# Patient Record
Sex: Male | Born: 1995 | Race: White | Hispanic: No | Marital: Single | Smoking: Never smoker
Health system: Southern US, Community
[De-identification: ages and names within clinical notes are randomized; demographics above are authoritative.]

---

## 2017-01-13 ENCOUNTER — Emergency Department: Payer: 59

## 2017-01-13 ENCOUNTER — Encounter: Payer: Self-pay | Admitting: Emergency Medicine

## 2017-01-13 ENCOUNTER — Observation Stay
Admission: EM | Admit: 2017-01-13 | Discharge: 2017-01-15 | Disposition: A | Discharge status: Home / Self Care | Payer: 59 | Attending: Surgery | Admitting: Surgery

## 2017-01-13 DIAGNOSIS — R109 Unspecified abdominal pain: Secondary | ICD-10-CM | POA: Diagnosis present

## 2017-01-13 DIAGNOSIS — K810 Acute cholecystitis: Secondary | ICD-10-CM | POA: Diagnosis not present

## 2017-01-13 DIAGNOSIS — K8000 Calculus of gallbladder with acute cholecystitis without obstruction: Principal | ICD-10-CM | POA: Insufficient documentation

## 2017-01-13 LAB — COMPREHENSIVE METABOLIC PANEL
ALBUMIN: 4.6 g/dL (ref 3.5–5.0)
ALK PHOS: 74 U/L (ref 38–126)
ALT: 62 U/L (ref 17–63)
AST: 31 U/L (ref 15–41)
Anion gap: 10 (ref 5–15)
BILIRUBIN TOTAL: 1.2 mg/dL (ref 0.3–1.2)
BUN: 9 mg/dL (ref 6–20)
CALCIUM: 9.7 mg/dL (ref 8.9–10.3)
CO2: 23 mmol/L (ref 22–32)
Chloride: 105 mmol/L (ref 101–111)
Creatinine, Ser: 0.83 mg/dL (ref 0.61–1.24)
GFR calc Af Amer: >60 mL/min (ref >60)
GFR calc non Af Amer: >60 mL/min (ref >60)
Glucose, Bld: 120 mg/dL — ABNORMAL HIGH (ref 65–99)
POTASSIUM: 4 mmol/L (ref 3.5–5.1)
Sodium: 138 mmol/L (ref 135–145)
TOTAL PROTEIN: 8.3 g/dL — AB (ref 6.5–8.1)

## 2017-01-13 LAB — CBC WITH DIFFERENTIAL/PLATELET
BASOS ABS: 0 10*3/uL (ref 0–0.1)
BASOS PCT: 0 %
Eosinophils Absolute: 0.3 10*3/uL (ref 0–0.7)
Eosinophils Relative: 2 %
HEMATOCRIT: 49.7 % (ref 40.0–52.0)
HEMOGLOBIN: 17.7 g/dL (ref 13.0–18.0)
LYMPHS PCT: 7 %
Lymphs Abs: 1.1 10*3/uL (ref 1.0–3.6)
MCH: 29 pg (ref 26.0–34.0)
MCHC: 35.7 g/dL (ref 32.0–36.0)
MCV: 81.2 fL (ref 80.0–100.0)
Monocytes Absolute: 0.8 10*3/uL (ref 0.2–1.0)
Monocytes Relative: 5 %
NEUTROS ABS: 13 10*3/uL — AB (ref 1.4–6.5)
NEUTROS PCT: 86 %
Platelets: 311 10*3/uL (ref 150–440)
RBC: 6.12 MIL/uL — AB (ref 4.40–5.90)
RDW: 13.3 % (ref 11.5–14.5)
WBC: 15.2 10*3/uL — ABNORMAL HIGH (ref 3.8–10.6)

## 2017-01-13 LAB — URINALYSIS, COMPLETE (UACMP) WITH MICROSCOPIC
BILIRUBIN URINE: NEGATIVE
Glucose, UA: NEGATIVE mg/dL
Hgb urine dipstick: NEGATIVE
Ketones, ur: 20 mg/dL — AB
LEUKOCYTES UA: NEGATIVE
Nitrite: NEGATIVE
Protein, ur: NEGATIVE mg/dL
SPECIFIC GRAVITY, URINE: 1.012 (ref 1.005–1.030)
SQUAMOUS EPITHELIAL / LPF: NONE SEEN
pH: 6 (ref 5.0–8.0)

## 2017-01-13 LAB — LIPASE, BLOOD: Lipase: 26 U/L (ref 11–51)

## 2017-01-13 MED ORDER — MORPHINE SULFATE (PF) 4 MG/ML IV SOLN
4.0000 mg | Freq: Once | INTRAVENOUS | Status: AC
  Administered 2017-01-13: 4 mg via INTRAVENOUS
  Filled 2017-01-13: qty 1

## 2017-01-13 MED ORDER — HYDROMORPHONE HCL 1 MG/ML IJ SOLN: 0.5000 mg | INTRAMUSCULAR | Status: DC | PRN

## 2017-01-13 MED ORDER — ONDANSETRON HCL 4 MG/2ML IJ SOLN
4.0000 mg | Freq: Four times a day (QID) | INTRAMUSCULAR | Status: DC | PRN
  Filled 2017-01-13: qty 2

## 2017-01-13 MED ORDER — IOPAMIDOL (ISOVUE-300) INJECTION 61%
30.0000 mL | Freq: Once | INTRAVENOUS | Status: AC | PRN
  Administered 2017-01-13: 30 mL via ORAL

## 2017-01-13 MED ORDER — PANTOPRAZOLE SODIUM 40 MG IV SOLR
40.0000 mg | Freq: Every day | INTRAVENOUS | Status: DC
  Administered 2017-01-13 – 2017-01-14 (×2): 40 mg via INTRAVENOUS
  Filled 2017-01-13 (×2): qty 40

## 2017-01-13 MED ORDER — HYDROMORPHONE HCL 1 MG/ML IJ SOLN
1.0000 mg | Freq: Once | INTRAMUSCULAR | Status: AC
  Administered 2017-01-13: 1 mg via INTRAVENOUS
  Filled 2017-01-13: qty 1

## 2017-01-13 MED ORDER — ONDANSETRON HCL 4 MG/2ML IJ SOLN
4.0000 mg | Freq: Once | INTRAMUSCULAR | Status: AC
  Administered 2017-01-13: 4 mg via INTRAVENOUS
  Filled 2017-01-13: qty 2

## 2017-01-13 MED ORDER — IOPAMIDOL (ISOVUE-300) INJECTION 61%
100.0000 mL | Freq: Once | INTRAVENOUS | Status: AC | PRN
  Administered 2017-01-13: 100 mL via INTRAVENOUS

## 2017-01-13 MED ORDER — LACTATED RINGERS IV SOLN
125.0000 mL/h | INTRAVENOUS | Status: DC
  Administered 2017-01-13 – 2017-01-14 (×3): 125 mL/h via INTRAVENOUS

## 2017-01-13 MED ORDER — PIPERACILLIN-TAZOBACTAM 3.375 G IVPB
3.3750 g | Freq: Three times a day (TID) | INTRAVENOUS | Status: DC
  Administered 2017-01-13 – 2017-01-15 (×6): 3.375 g via INTRAVENOUS
  Filled 2017-01-13 (×7): qty 50

## 2017-01-13 MED ORDER — HEPARIN SODIUM (PORCINE) 5000 UNIT/ML IJ SOLN
5000.0000 [IU] | Freq: Three times a day (TID) | INTRAMUSCULAR | Status: DC
  Filled 2017-01-13: qty 1

## 2017-01-13 MED ORDER — KETOROLAC TROMETHAMINE 30 MG/ML IJ SOLN
30.0000 mg | Freq: Four times a day (QID) | INTRAMUSCULAR | Status: DC
  Administered 2017-01-13 – 2017-01-14 (×3): 30 mg via INTRAVENOUS
  Filled 2017-01-13 (×5): qty 1

## 2017-01-13 MED ORDER — ONDANSETRON 4 MG PO TBDP: 4.0000 mg | ORAL_TABLET | Freq: Four times a day (QID) | ORAL | Status: DC | PRN

## 2017-01-13 MED ORDER — SODIUM CHLORIDE 0.9 % IV BOLUS (SEPSIS)
1000.0000 mL | Freq: Once | INTRAVENOUS | Status: AC
  Administered 2017-01-13: 1000 mL via INTRAVENOUS

## 2017-01-13 NOTE — ED Provider Notes (Addendum)
[facility] Emergency Department Provider Note  ____________________________________________   I have reviewed the triage vital signs and the nursing notes.   HISTORY  Chief Complaint Bloated and Abdominal Pain    HPI Kenneth Joyce is a 21 y.o. male who presents today complaining of abdominal pain mostly in the epigastric and right upper quadrant. There for 2 days. Has been burping. Denies fever. Has had some slightly loose stool. Denies melena bright red blood per rectum or fever. Has not vomited but has been nauseated. Has not had this before. No prior surgical history. No other medical problems. He is sharp, mostly in epigastric right upper quadrant does not radiate, nothing makes it better food might make it worse. There for 2 days.     History reviewed. No pertinent past medical history.  There are no active problems to display for this patient.   History reviewed. No pertinent surgical history.  Prior to Admission medications   Not on File    Allergies Patient has no known allergies.  History reviewed. No pertinent family history.  Social History Social History  Substance Use Topics  . Smoking status: Never Smoker  . Smokeless tobacco: Never Used  . Alcohol use No    Review of Systems Constitutional: No fever/chills Eyes: No visual changes. ENT: No sore throat. No stiff neck no neck pain Cardiovascular: Denies chest pain. Respiratory: Denies shortness of breath. Gastrointestinal:   no vomiting.  No diarrhea.  No constipation. Genitourinary: Negative for dysuria. Musculoskeletal: Negative lower extremity swelling Skin: Negative for rash. Neurological: Negative for severe headaches, focal weakness or numbness. 10-point ROS otherwise negative.  ____________________________________________   PHYSICAL EXAM:  VITAL SIGNS: ED Triage Vitals  Enc Vitals Group     BP 01/13/17 0655 (!) 156/87     Pulse Rate 01/13/17 0655 94   Resp 01/13/17 0655 17     Temp 01/13/17 0655 98 F (36.7 C)     Temp Source 01/13/17 0655 Oral     SpO2 01/13/17 0655 100 %     Weight 01/13/17 0655 200 lb (90.7 kg)     Height 01/13/17 0655  (1.702 m)     Head Circumference --      Peak Flow --      Pain Score 01/13/17 0900 8     Pain Loc --      Pain Edu? --      Excl. in GC? --     Constitutional: Alert and oriented. Well appearing and in no acute distress. Eyes: Conjunctivae are normal. PERRL. EOMI. Head: Atraumatic. Nose: No congestion/rhinnorhea. Mouth/Throat: Mucous membranes are moist.  Oropharynx non-erythematous. Neck: No stridor.   Nontender with no meningismus Cardiovascular: Normal rate, regular rhythm. Grossly normal heart sounds.  Good peripheral circulation. Respiratory: Normal respiratory effort.  No retractions. Lungs CTAB. Abdominal:Diffuse tenderness noted but press when the epigastric and specifically the right upper quadrant tenderness with voluntary guarding and no rebound.. No distention. Back:  There is no focal tenderness or step off.  there is no midline tenderness there are no lesions noted. there is no CVA tenderness Musculoskeletal: No lower extremity tenderness, no upper extremity tenderness. No joint effusions, no DVT signs strong distal pulses no edema Neurologic:  Normal speech and language. No gross focal neurologic deficits are appreciated.  Skin:  Skin is warm, dry and intact. No rash noted. Psychiatric: Mood and affect are normal. Speech and behavior are normal.  ____________________________________________   LABS (all labs ordered are listed, but  only abnormal results are displayed)  Labs Reviewed  CBC WITH DIFFERENTIAL/PLATELET - Abnormal; Notable for the following:       Result Value   WBC 15.2 (*)    RBC 6.12 (*)    Neutro Abs 13.0 (*)    All other components within normal limits  COMPREHENSIVE METABOLIC PANEL - Abnormal; Notable for the following:    Glucose, Bld 120 (*)     Total Protein 8.3 (*)    All other components within normal limits  LIPASE, BLOOD  URINALYSIS, COMPLETE (UACMP) WITH MICROSCOPIC   ____________________________________________  EKG  I personally interpreted any EKGs ordered by me or triage  ____________________________________________  RADIOLOGY  I reviewed any imaging ordered by me or triage that were performed during my shift and, if possible, patient and/or family made aware of any abnormal findings. ____________________________________________   PROCEDURES  Procedure(s) performed: None  Procedures  Critical Care performed: None  ____________________________________________   INITIAL IMPRESSION / ASSESSMENT AND PLAN / ED COURSE  Pertinent labs & imaging results that were available during my care of the patient were reviewed by me and considered in my medical decision making (see chart for details).  Patient suffers from obesity, has abdominal pain for 2 days, diffuse but really localizing to the right upper quadrant on exam. He has slight right lower quadrant tenderness but all is guarding in all of his focality is around the gallbladder. Ultrasound shows gallstones but no cholecystitis. White count is elevated. He's had 2 doses of pain medication and still quite tender. We'll call surgery for further evaluation.  ----------------------------------------- 9:40 AM on 01/13/2017 -----------------------------------------  Surgery in the OR, we will await their call back.  ----------------------------------------- 10:49 AM on 01/13/2017 -----------------------------------------  Surgery agrees this is most likely gallbladder but they also agree that he still has some residual lower abdominal discomfort. The possibility of atypical appendicitis is noted. We will obtain CT scan. However, surgery does feel that he likely will need to be admitted given ongoing symptoms. Patient has vomited times one since he's been here has  ongoing discomfort although it is somewhat better after pain medication. If CT scan shows appendicitis obviously he will be admitted to surgery, but if it does not, and we do find only gallbladder disease, which is the more likely diagnosis, he will be admitted to surgery.   ____________________________________________   FINAL CLINICAL IMPRESSION(S) / ED DIAGNOSES  Final diagnoses:  Abdominal pain      This chart was dictated using voice recognition software.  Despite best efforts to proofread,  errors can occur which can change meaning.      Jeanmarie Plant, MD 01/13/17 9528    Jeanmarie Plant, MD 01/13/17 1049

## 2017-01-13 NOTE — H&P (Signed)
Date of Admission:  01/13/2017  Reason for Admission:  Acute cholecystitis  History of Present Illness: Kenneth Joyce is a 21 y.o. male who presents with a 2 day history of right upper quadrant abdominal pain, associated with nausea and starting today, vomiting.  The patient reports that he has not noted these symptoms before.  Denies any fevers at home.  Reports trying some po intake yesterday but felt worse pain and nausea.  He did have emesis today in the ED.  Continues having flatus and had a BM in the ED as well.  No ameliorating factors.    Initially, ultrasound was done in ED which showed cholelithiasis only with no thickening or fluid.  The patient also had complaint on initial evaluation of right lower quadrant pain on palpation, so CT scan was also obtained which showed a normal appendix.   Had a WBC 15.2 with normal LFTs.  Past Medical History: --None  Past Surgical History: --None  Home Medications: Prior to Admission medications   None    Allergies: No Known Allergies  Social History:  reports that he has never smoked. He has never used smokeless tobacco. He reports that he does not drink alcohol or use drugs.   Family History: No family members that have required cholecystectomy  Review of Systems: Review of Systems  Constitutional: Negative for chills and fever.  HENT: Negative for hearing loss.   Eyes: Negative for blurred vision.  Respiratory: Negative for cough.   Cardiovascular: Negative for chest pain.  Gastrointestinal: Positive for abdominal pain, nausea and vomiting.  Genitourinary: Negative for dysuria.  Musculoskeletal: Negative for myalgias.  Skin: Negative for rash.  Neurological: Negative for dizziness.  Psychiatric/Behavioral: Negative for depression.  All other systems reviewed and are negative.   Physical Exam BP 138/85 (BP Location: Right Arm)   Pulse 95   Temp 98.3 F (36.8 C) (Oral)   Resp 15   Ht  (1.702 m)   Wt 90.7 kg  (200 lb)   SpO2 100%   BMI 31.32 kg/m  CONSTITUTIONAL: No acute distress HEENT:  Normocephalic, atraumatic, extraocular motion intact. NECK: Trachea is midline, and there is no jugular venous distension.  RESPIRATORY:  Lungs are clear, and breath sounds are equal bilaterally. Normal respiratory effort without pathologic use of accessory muscles. CARDIOVASCULAR: Heart is regular without murmurs, gallops, or rubs. GI: The abdomen is soft, non-distended.  On initial exam, the patient had a positive Murphy's sign as well as some right lower quadrant tenderness.  After CT scan, the patient reports feeling better and had tenderness to palpation in right upper quadrant only, but no Murphy's sign  Had not received pain medication in 4 hrs. MUSCULOSKELETAL:  Normal muscle strength and tone in all four extremities.  No peripheral edema or cyanosis. SKIN: Skin turgor is normal. There are no pathologic skin lesions.  NEUROLOGIC:  Motor and sensation is grossly normal.  Cranial nerves are grossly intact. PSYCH:  Alert and oriented to person, place and time. Affect is normal.  Laboratory Analysis: Results for orders placed or performed during the hospital encounter of 01/13/17 (from the past 24 hour(s))  CBC with Differential     Status: Abnormal   Collection Time: 01/13/17  7:40 AM  Result Value Ref Range   WBC 15.2 (H) 3.8 - 10.6 K/uL   RBC 6.12 (H) 4.40 - 5.90 MIL/uL   Hemoglobin 17.7 13.0 - 18.0 g/dL   HCT 16.1 09.6 - 04.5 %   MCV 81.2 80.0 -  100.0 fL   MCH 29.0 26.0 - 34.0 pg   MCHC 35.7 32.0 - 36.0 g/dL   RDW 16.1 09.6 - 04.5 %   Platelets 311 150 - 440 K/uL   Neutrophils Relative % 86 %   Neutro Abs 13.0 (H) 1.4 - 6.5 K/uL   Lymphocytes Relative 7 %   Lymphs Abs 1.1 1.0 - 3.6 K/uL   Monocytes Relative 5 %   Monocytes Absolute 0.8 0.2 - 1.0 K/uL   Eosinophils Relative 2 %   Eosinophils Absolute 0.3 0 - 0.7 K/uL   Basophils Relative 0 %   Basophils Absolute 0.0 0 - 0.1 K/uL   Comprehensive metabolic panel     Status: Abnormal   Collection Time: 01/13/17  7:40 AM  Result Value Ref Range   Sodium 138 135 - 145 mmol/L   Potassium 4.0 3.5 - 5.1 mmol/L   Chloride 105 101 - 111 mmol/L   CO2 23 22 - 32 mmol/L   Glucose, Bld 120 (H) 65 - 99 mg/dL   BUN 9 6 - 20 mg/dL   Creatinine, Ser 4.09 0.61 - 1.24 mg/dL   Calcium 9.7 8.9 - 81.1 mg/dL   Total Protein 8.3 (H) 6.5 - 8.1 g/dL   Albumin 4.6 3.5 - 5.0 g/dL   AST 31 15 - 41 U/L   ALT 62 17 - 63 U/L   Alkaline Phosphatase 74 38 - 126 U/L   Total Bilirubin 1.2 0.3 - 1.2 mg/dL   GFR calc non Af Amer >60 >60 mL/min   GFR calc Af Amer >60 >60 mL/min   Anion gap 10 5 - 15  Lipase, blood     Status: None   Collection Time: 01/13/17  7:40 AM  Result Value Ref Range   Lipase 26 11 - 51 U/L  Urinalysis, Complete w Microscopic     Status: Abnormal   Collection Time: 01/13/17  7:40 AM  Result Value Ref Range   Color, Urine YELLOW (A) YELLOW   APPearance CLEAR (A) CLEAR   Specific Gravity, Urine 1.012 1.005 - 1.030   pH 6.0 5.0 - 8.0   Glucose, UA NEGATIVE NEGATIVE mg/dL   Hgb urine dipstick NEGATIVE NEGATIVE   Bilirubin Urine NEGATIVE NEGATIVE   Ketones, ur 20 (A) NEGATIVE mg/dL   Protein, ur NEGATIVE NEGATIVE mg/dL   Nitrite NEGATIVE NEGATIVE   Leukocytes, UA NEGATIVE NEGATIVE   RBC / HPF 0-5 0 - 5 RBC/hpf   WBC, UA 0-5 0 - 5 WBC/hpf   Bacteria, UA RARE (A) NONE SEEN   Squamous Epithelial / LPF NONE SEEN NONE SEEN   Mucous PRESENT     Imaging: Ct Abdomen Pelvis W Contrast  Result Date: 01/13/2017 CLINICAL DATA:  pain mostly in the epigastric and right upper quadrant. There for 2 days. Has been burping. Denies fever. Has had some slightly loose stool. Denies melena bright red blood per rectum or fever. Has not vomited but has been nauseated. Has not had this before. No prior surgical history. No other medical problems. He is sharp, mostly in epigastric right upper quadrant does not radiate, nothing makes it  better food might make it worse. EXAM: CT ABDOMEN AND PELVIS WITH CONTRAST TECHNIQUE: Multidetector CT imaging of the abdomen and pelvis was performed using the standard protocol following bolus administration of intravenous contrast. CONTRAST:  ISOVUE-300 IOPAMIDOL (ISOVUE-300) INJECTION 61% COMPARISON:  Ultrasound from earlier the same day FINDINGS: Lower chest: No acute abnormality.  Moderate Hiatal hernia. Hepatobiliary: No focal  liver abnormality is seen. No calcified gallstones, gallbladder wall thickening, or biliary dilatation. Pancreas: Unremarkable. No pancreatic ductal dilatation or surrounding inflammatory changes. Spleen: Normal in size without focal abnormality. Adrenals/Urinary Tract: Adrenal glands are unremarkable. Kidneys are normal, without renal calculi, focal lesion, or hydronephrosis. Bladder is incompletely distended with mild wall thickening suggested. Stomach/Bowel: Moderate Hiatal hernia. Small bowel nondilated. Good distal contrast progression. Normal appendix. Colon is nondilated, unremarkable. Vascular/Lymphatic: No significant vascular findings are present. No enlarged abdominal or pelvic lymph nodes. Reproductive: Mild prostatic enlargement with central coarse calcifications. Other: No ascites.  No free air. Musculoskeletal: No acute or significant osseous findings. IMPRESSION: 1. No acute findings. 2. Moderate hiatal hernia. 3. Prostatic enlargement with mild thickening of the wall of the urinary bladder suggesting a degree of outflow obstruction. Electronically Signed   By: Corlis Leak M.D.   On: 01/13/2017 12:49   Dg Abdomen Acute W/chest  Result Date: 01/13/2017 CLINICAL DATA:  Abdominal pain, bloating. EXAM: DG ABDOMEN ACUTE W/ 1V CHEST COMPARISON:  None. FINDINGS: There is no evidence of dilated bowel loops or free intraperitoneal air. No radiopaque calculi or other significant radiographic abnormality is seen. Heart size and mediastinal contours are within normal  limits. Both lungs are clear. IMPRESSION: Negative abdominal radiographs.  No acute cardiopulmonary disease. Electronically Signed   By: Charlett Nose M.D.   On: 01/13/2017 08:10   US Abdomen Limited Ruq  Result Date: 01/13/2017 CLINICAL DATA:  Abdominal pain EXAM: US ABDOMEN LIMITED - RIGHT UPPER QUADRANT COMPARISON:  None. FINDINGS: Gallbladder: Multiple mobile small stones, the largest 5 mm. No wall thickening. Negative sonographic Murphy's. Common bile duct: Diameter: Normal caliber, 5 mm Liver: Increased echotexture compatible with fatty infiltration. No focal abnormality or biliary ductal dilatation. IMPRESSION: Several small mobile gallstones.  No evidence of cholecystitis. Fatty infiltration of the liver. Electronically Signed   By: Charlett Nose M.D.   On: 01/13/2017 08:40    Assessment and Plan: This is a 21 y.o. male who presents with a 2-day history of abdominal pain and nausea.  He has an elevated WBC and although he feels better on second examination, he likely has acute cholecystitis.  I have independently viewed the patient's imaging studies and reviewed his laboratory studies.  Overall, his imaging is not too impressive, but he was initially very tender with a positive Murphy's sign.  His WBC is elevated at 15.2.   Was discussing with the patient potential surgical management for cholecystitis given his symptoms, but the patient preferred to attempt conservative management first.  Discussed with the patient that this would entail being NPO with IV fluid hydration as well as IV antibiotics.  He does understand that depending on his progress, he could still require a cholecystectomy during this admission if there is failure of medical management.  He does understand that if things improve, then we would slowly advance his diet and transition to oral antibiotics.  Discussed that in the future, would recommend performing a cholecystectomy, but this would be elective as long as he improves during  this admission.  The patient understands this plan and all of his questions have been answered.    Howie Ill, MD [facility]

## 2017-01-13 NOTE — ED Triage Notes (Signed)
Pt ambulatory to triage with steady gait, no distress noted. Pt reports having bloating since Tuesday, no BM, pt passing gas and having "sulfur burps." Pt denies N/V.

## 2017-01-14 DIAGNOSIS — K810 Acute cholecystitis: Secondary | ICD-10-CM | POA: Diagnosis not present

## 2017-01-14 LAB — COMPREHENSIVE METABOLIC PANEL
ALK PHOS: 54 U/L (ref 38–126)
ALT: 37 U/L (ref 17–63)
ANION GAP: 3 — AB (ref 5–15)
AST: 18 U/L (ref 15–41)
Albumin: 3.4 g/dL — ABNORMAL LOW (ref 3.5–5.0)
BILIRUBIN TOTAL: 1.3 mg/dL — AB (ref 0.3–1.2)
BUN: 6 mg/dL (ref 6–20)
CALCIUM: 8.6 mg/dL — AB (ref 8.9–10.3)
CO2: 28 mmol/L (ref 22–32)
Chloride: 110 mmol/L (ref 101–111)
Creatinine, Ser: 0.74 mg/dL (ref 0.61–1.24)
GFR calc Af Amer: >60 mL/min (ref >60)
GFR calc non Af Amer: >60 mL/min (ref >60)
GLUCOSE: 97 mg/dL (ref 65–99)
Potassium: 3.3 mmol/L — ABNORMAL LOW (ref 3.5–5.1)
Sodium: 141 mmol/L (ref 135–145)
TOTAL PROTEIN: 6 g/dL — AB (ref 6.5–8.1)

## 2017-01-14 LAB — CBC WITH DIFFERENTIAL/PLATELET
BASOS PCT: 0 %
Basophils Absolute: 0 10*3/uL (ref 0–0.1)
Eosinophils Absolute: 0.8 10*3/uL — ABNORMAL HIGH (ref 0–0.7)
Eosinophils Relative: 7 %
HEMATOCRIT: 40.8 % (ref 40.0–52.0)
HEMOGLOBIN: 14.5 g/dL (ref 13.0–18.0)
LYMPHS ABS: 2.3 10*3/uL (ref 1.0–3.6)
Lymphocytes Relative: 20 %
MCH: 29.4 pg (ref 26.0–34.0)
MCHC: 35.5 g/dL (ref 32.0–36.0)
MCV: 82.7 fL (ref 80.0–100.0)
MONOS PCT: 9 %
Monocytes Absolute: 1.1 10*3/uL — ABNORMAL HIGH (ref 0.2–1.0)
NEUTROS ABS: 7.6 10*3/uL — AB (ref 1.4–6.5)
Neutrophils Relative %: 64 %
Platelets: 242 10*3/uL (ref 150–440)
RBC: 4.93 MIL/uL (ref 4.40–5.90)
RDW: 13.8 % (ref 11.5–14.5)
WBC: 11.8 10*3/uL — ABNORMAL HIGH (ref 3.8–10.6)

## 2017-01-14 MED ORDER — BISMUTH SUBSALICYLATE 262 MG/15ML PO SUSP
30.0000 mL | Freq: Four times a day (QID) | ORAL | Status: DC | PRN
  Administered 2017-01-14: 30 mL via ORAL
  Filled 2017-01-14 (×2): qty 118

## 2017-01-14 MED ORDER — OXYCODONE-ACETAMINOPHEN 5-325 MG PO TABS: 1.0000 | ORAL_TABLET | ORAL | Status: DC | PRN

## 2017-01-14 NOTE — Progress Notes (Signed)
CC: Cholecystitis Subjective: Feeling better no pain since last night He is hungry U/S personally reviewed GS nml CBD Wbc improving  Objective: Vital signs in last 24 hours: Temp:  [98.1 F (36.7 C)-98.8 F (37.1 C)] 98.8 F (37.1 C) (04/28 0451) Pulse Rate:  [95-104] 104 (04/28 0451) Resp:  [15-20] 20 (04/28 0451) BP: (127-138)/(59-87) 130/76 (04/28 0451) SpO2:  [95 %-100 %] 100 % (04/28 0451) Last BM Date: 01/13/17  Intake/Output from previous day: 04/27 0701 - 04/28 0700 In: 1440 [I.V.:1360; IV Piggyback:80] Out: -  Intake/Output this shift: No intake/output data recorded.  Physical exam: NAD,  Non toxic Abd: soft, NT, no peritonitis, no murphy Ext: no edema, well perfused   Lab Results: CBC   Recent Labs  01/13/17 0740 01/14/17 0415  WBC 15.2* 11.8*  HGB 17.7 14.5  HCT 49.7 40.8  PLT 311 242   BMET  Recent Labs  01/13/17 0740 01/14/17 0415  NA 138 141  K 4.0 3.3*  CL 105 110  CO2 23 28  GLUCOSE 120* 97  BUN 9 6  CREATININE 0.83 0.74  CALCIUM 9.7 8.6*   PT/INR No results for input(s): LABPROT, INR in the last 72 hours. ABG No results for input(s): PHART, HCO3 in the last 72 hours.  Invalid input(s): PCO2, PO2  Studies/Results: Ct Abdomen Pelvis W Contrast  Result Date: 01/13/2017 CLINICAL DATA:  pain mostly in the epigastric and right upper quadrant. There for 2 days. Has been burping. Denies fever. Has had some slightly loose stool. Denies melena bright red blood per rectum or fever. Has not vomited but has been nauseated. Has not had this before. No prior surgical history. No other medical problems. He is sharp, mostly in epigastric right upper quadrant does not radiate, nothing makes it better food might make it worse. EXAM: CT ABDOMEN AND PELVIS WITH CONTRAST TECHNIQUE: Multidetector CT imaging of the abdomen and pelvis was performed using the standard protocol following bolus administration of intravenous contrast. CONTRAST:   ISOVUE-300 IOPAMIDOL (ISOVUE-300) INJECTION 61% COMPARISON:  Ultrasound from earlier the same day FINDINGS: Lower chest: No acute abnormality.  Moderate Hiatal hernia. Hepatobiliary: No focal liver abnormality is seen. No calcified gallstones, gallbladder wall thickening, or biliary dilatation. Pancreas: Unremarkable. No pancreatic ductal dilatation or surrounding inflammatory changes. Spleen: Normal in size without focal abnormality. Adrenals/Urinary Tract: Adrenal glands are unremarkable. Kidneys are normal, without renal calculi, focal lesion, or hydronephrosis. Bladder is incompletely distended with mild wall thickening suggested. Stomach/Bowel: Moderate Hiatal hernia. Small bowel nondilated. Good distal contrast progression. Normal appendix. Colon is nondilated, unremarkable. Vascular/Lymphatic: No significant vascular findings are present. No enlarged abdominal or pelvic lymph nodes. Reproductive: Mild prostatic enlargement with central coarse calcifications. Other: No ascites.  No free air. Musculoskeletal: No acute or significant osseous findings. IMPRESSION: 1. No acute findings. 2. Moderate hiatal hernia. 3. Prostatic enlargement with mild thickening of the wall of the urinary bladder suggesting a degree of outflow obstruction. Electronically Signed   By: Corlis Leak M.D.   On: 01/13/2017 12:49   Dg Abdomen Acute W/chest  Result Date: 01/13/2017 CLINICAL DATA:  Abdominal pain, bloating. EXAM: DG ABDOMEN ACUTE W/ 1V CHEST COMPARISON:  None. FINDINGS: There is no evidence of dilated bowel loops or free intraperitoneal air. No radiopaque calculi or other significant radiographic abnormality is seen. Heart size and mediastinal contours are within normal limits. Both lungs are clear. IMPRESSION: Negative abdominal radiographs.  No acute cardiopulmonary disease. Electronically Signed   By: Charlett Nose M.D.  On: 01/13/2017 08:10   US Abdomen Limited Ruq  Result Date: 01/13/2017 CLINICAL DATA:  Abdominal  pain EXAM: US ABDOMEN LIMITED - RIGHT UPPER QUADRANT COMPARISON:  None. FINDINGS: Gallbladder: Multiple mobile small stones, the largest 5 mm. No wall thickening. Negative sonographic Murphy's. Common bile duct: Diameter: Normal caliber, 5 mm Liver: Increased echotexture compatible with fatty infiltration. No focal abnormality or biliary ductal dilatation. IMPRESSION: Several small mobile gallstones.  No evidence of cholecystitis. Fatty infiltration of the liver. Electronically Signed   By: Charlett Nose M.D.   On: 01/13/2017 08:40    Anti-infectives: Anti-infectives    Start     Dose/Rate Route Frequency Ordered Stop   01/13/17 1400  piperacillin-tazobactam (ZOSYN) IVPB 3.375 g     3.375 g 12.5 mL/hr over 240 Minutes Intravenous Every 8 hours 01/13/17 1320        Assessment/Plan: Acute cholecystitis responding to medical management. Patient is not interested in surgical intervention at this time. Discussed with patient in detail about the need for a laparoscopic cholecystectomy eventually. He understands but not very excited about this idea. We'll continue to follow. I will advance his diet and place him on by mouth medications  Sterling Big, MD, [facility]  01/14/2017

## 2017-01-15 DIAGNOSIS — K810 Acute cholecystitis: Secondary | ICD-10-CM | POA: Diagnosis not present

## 2017-01-15 MED ORDER — CIPROFLOXACIN HCL 500 MG PO TABS
500.0000 mg | ORAL_TABLET | Freq: Two times a day (BID) | ORAL | Status: DC
  Filled 2017-01-15: qty 1

## 2017-01-15 MED ORDER — METRONIDAZOLE 500 MG PO TABS: 500.0000 mg | ORAL_TABLET | Freq: Three times a day (TID) | ORAL | 0 refills | Status: AC

## 2017-01-15 MED ORDER — CIPROFLOXACIN HCL 500 MG PO TABS: 500.0000 mg | ORAL_TABLET | Freq: Two times a day (BID) | ORAL | 0 refills | Status: AC

## 2017-01-15 MED ORDER — METRONIDAZOLE 500 MG PO TABS
500.0000 mg | ORAL_TABLET | Freq: Three times a day (TID) | ORAL | Status: DC
  Filled 2017-01-15: qty 1

## 2017-01-15 MED ORDER — OXYCODONE-ACETAMINOPHEN 5-325 MG PO TABS: 1.0000 | ORAL_TABLET | ORAL | 0 refills | Status: AC | PRN

## 2017-01-15 NOTE — Discharge Summary (Signed)
  Patient ID: Natividad Halls MRN: [id number] DOB/AGE: March 18, 1996 21 y.o.  Admit date: 01/13/2017 Discharge date: 01/15/2017   Discharge Diagnoses:  Active Problems:   Acute cholecystitis   Procedures:none  Hospital Course: 21 year old male admitted with right upper quadrant pain and workup showing evidence of calculus acute cholecystitis. Patient was very hesitant about surgical revision therefore he was admitted for medical therapy. He was actually hospitalized for 2 days and responded well to antibiotic therapy. The time of discharge he was ambulating, tolerating a regular diet. His vital signs were stable he was afebrile. His physical exam showed a male in no acute distress, awake and alert. Abdomen: Soft, nontender no peritonitis no Murphy. Extremities: No edema well perfused. Condition at time of discharge stable Have a follow up appointment in 2 weeks we will discuss elective lap cholecystectomy    Disposition: 01-Home or Self Care  Discharge Instructions    Call MD for:  difficulty breathing, headache or visual disturbances    Complete by:  As directed    Call MD for:  extreme fatigue    Complete by:  As directed    Call MD for:  hives    Complete by:  As directed    Call MD for:  persistant dizziness or light-headedness    Complete by:  As directed    Call MD for:  persistant nausea and vomiting    Complete by:  As directed    Call MD for:  redness, tenderness, or signs of infection (pain, swelling, redness, odor or green/yellow discharge around incision site)    Complete by:  As directed    Call MD for:  severe uncontrolled pain    Complete by:  As directed    Call MD for:  temperature >100.4    Complete by:  As directed    Diet - low sodium heart healthy    Complete by:  As directed    Discharge instructions    Complete by:  As directed    Please provide work/ school  Excuse during for hospitalization time.   Increase activity slowly    Complete by:  As directed      Allergies as of 01/15/2017   No Known Allergies     Medication List    TAKE these medications   ciprofloxacin 500 MG tablet Commonly known as:  CIPRO Take 1 tablet (500 mg total) by mouth 2 (two) times daily.   metroNIDAZOLE 500 MG tablet Commonly known as:  FLAGYL Take 1 tablet (500 mg total) by mouth every 8 (eight) hours.   oxyCODONE-acetaminophen 5-325 MG tablet Commonly known as:  PERCOCET/ROXICET Take 1-2 tablets by mouth every 4 (four) hours as needed for moderate pain.      Follow-up Information    Leafy Ro, MD Follow up in 2 week(s).   Specialty:  General Surgery Contact information: [address] [place] [address] [phone]            Sterling Big, MD FACS

## 2018-12-25 IMAGING — US US ABDOMEN LIMITED
1 series · 14 of 25 positions shown · non-contrast
Comparison: None.

CLINICAL DATA: Abdominal pain

EXAM:
US ABDOMEN LIMITED - RIGHT UPPER QUADRANT

[Series 1: us abdomen limited · 0.22mm/px · 14 of 50 slices shown]
[im 1/50]
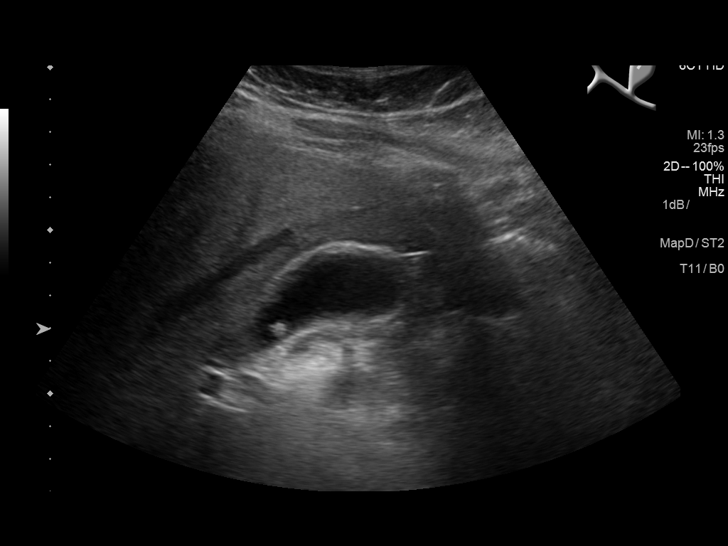
[im 5/50]
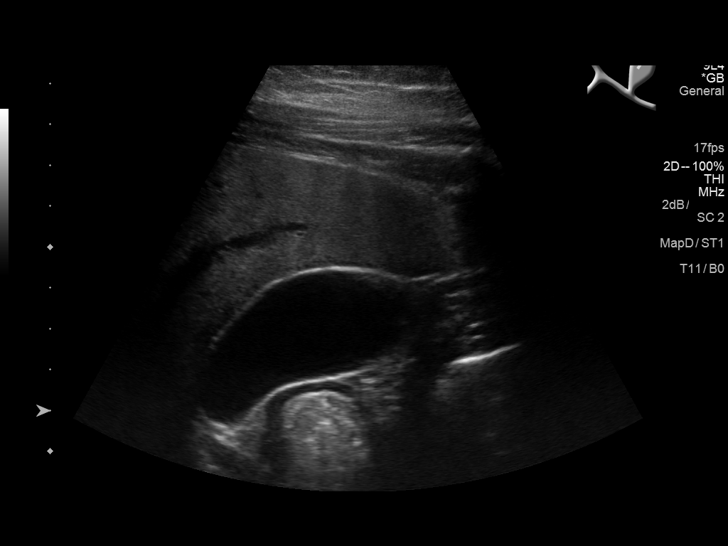
[im 9/50]
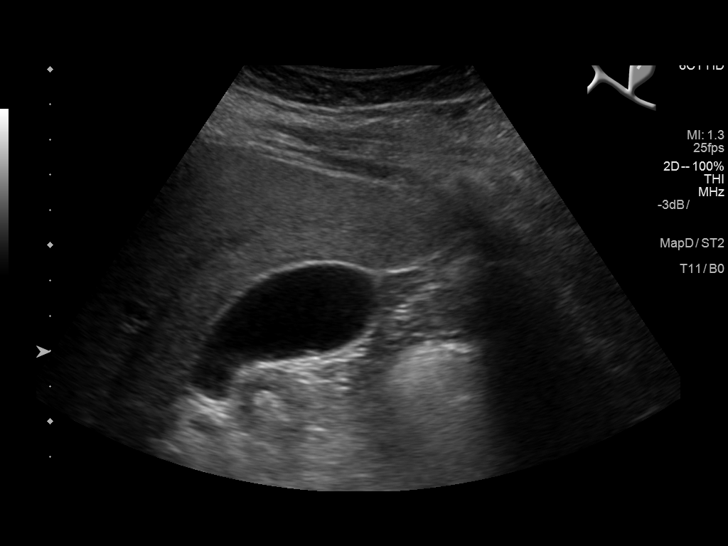
[im 13/50]
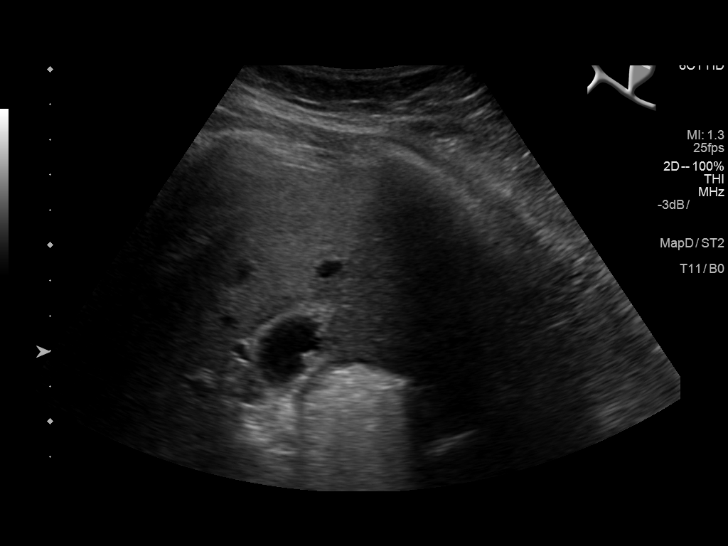
[im 17/50]
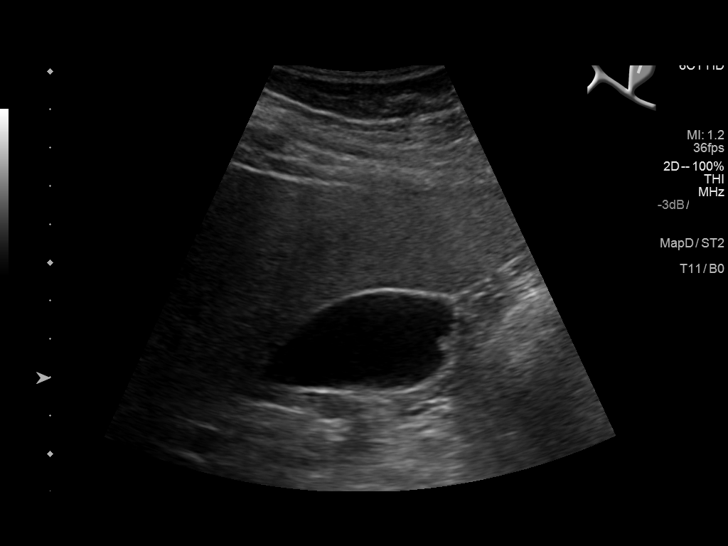
[im 19/50]
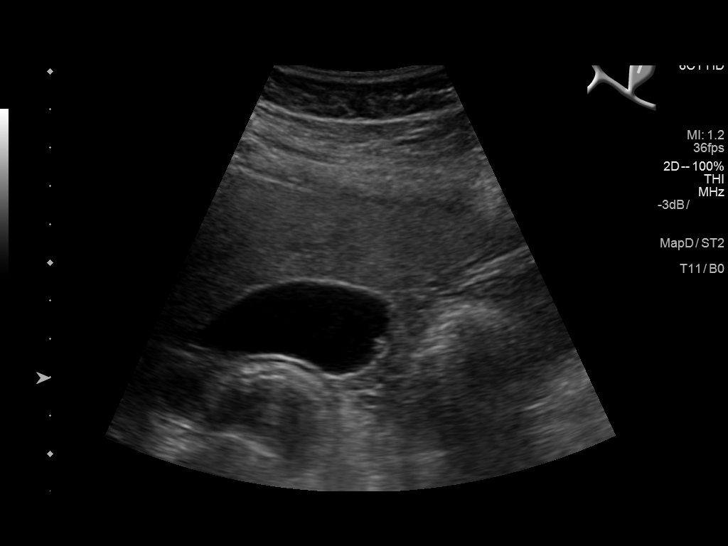
[im 23/50]
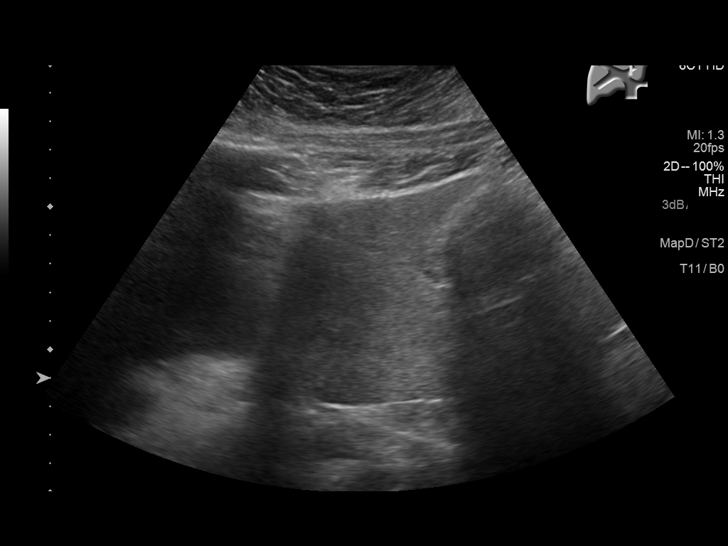
[im 27/50]
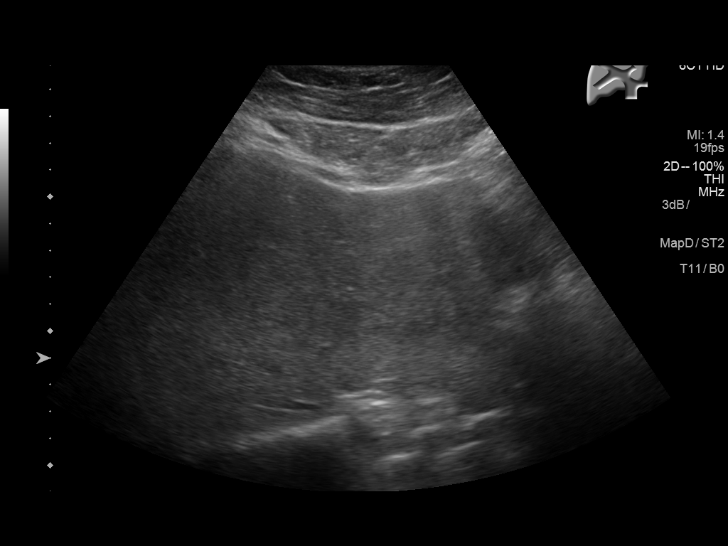
[im 31/50]
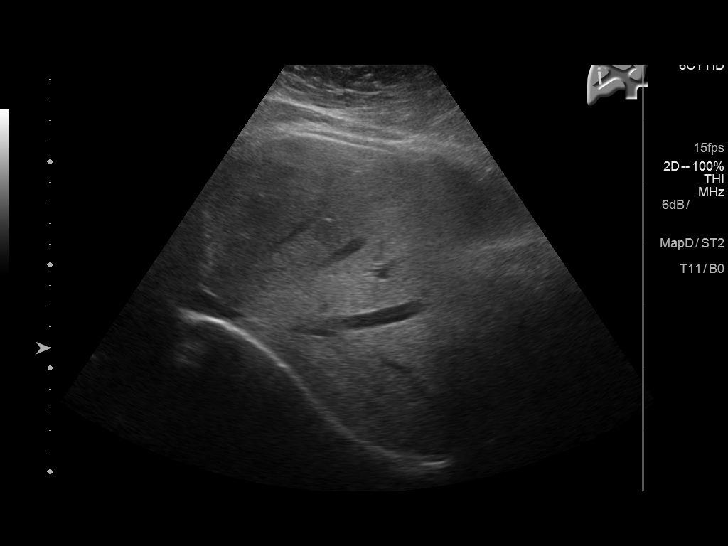
[im 33/50]
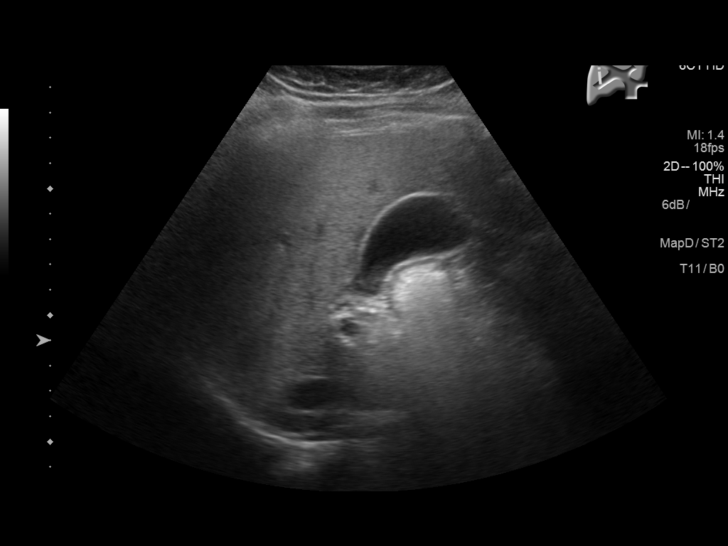
[im 37/50]
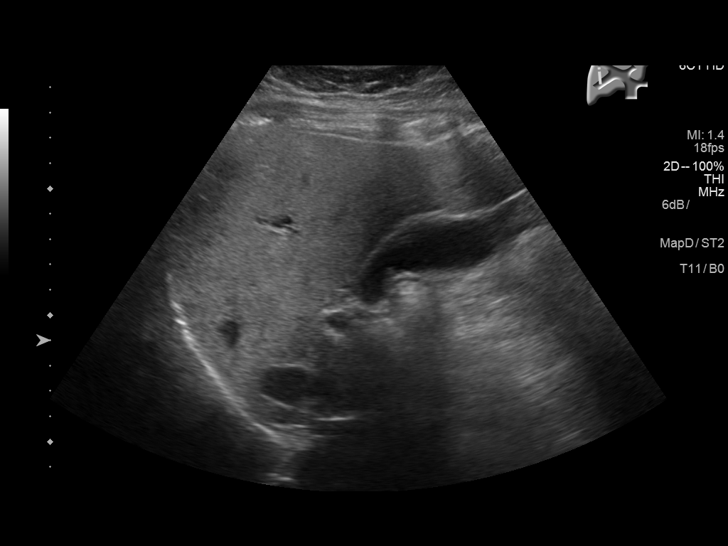
[im 41/50]
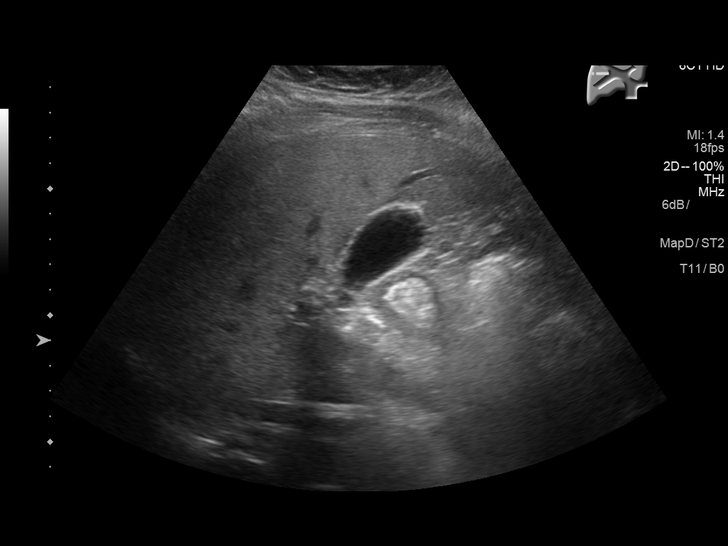
[im 45/50]
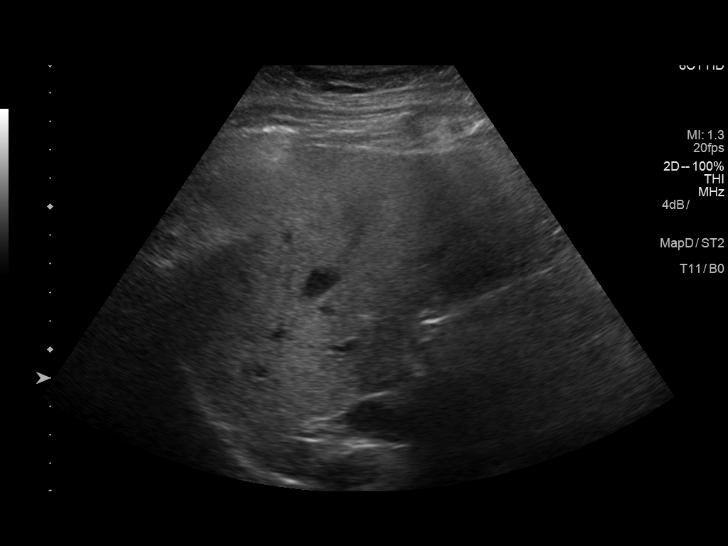
[im 50/50]
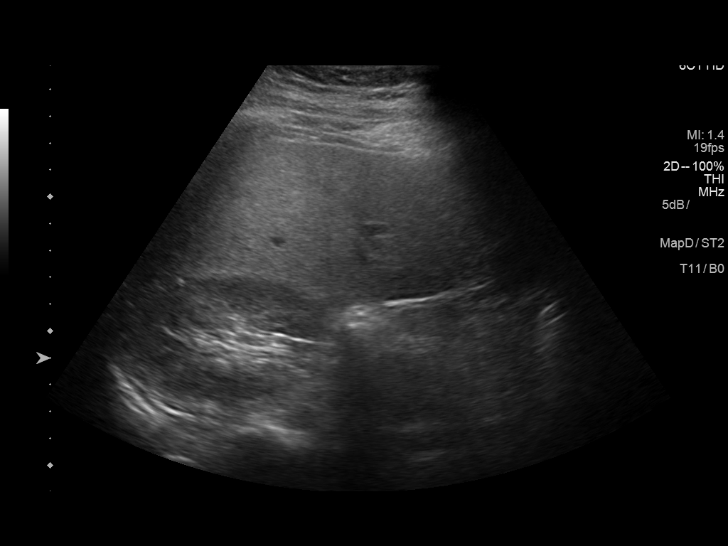

[14 of 25 positions shown; findings below may reference images not displayed]

FINDINGS: Gallbladder:

Multiple mobile small stones, the largest 5 mm. No wall thickening.
Negative sonographic Roberto.

Common bile duct:

Diameter: Normal caliber, 5 mm

Liver:

Increased echotexture compatible with fatty infiltration. No focal
abnormality or biliary ductal dilatation.
IMPRESSION: Several small mobile gallstones.  No evidence of cholecystitis.

Fatty infiltration of the liver.

## 2019-04-25 IMAGING — CR DG ABDOMEN ACUTE W/ 1V CHEST
1 series · 4 of 4 positions shown · non-contrast
Comparison: None.

CLINICAL DATA: Abdominal pain, bloating.

EXAM:
DG ABDOMEN ACUTE W/ 1V CHEST

[Series 1: dg abd acute w/chest · 0.14mm/px · 4 of 4 slices shown]
[im 1/4]
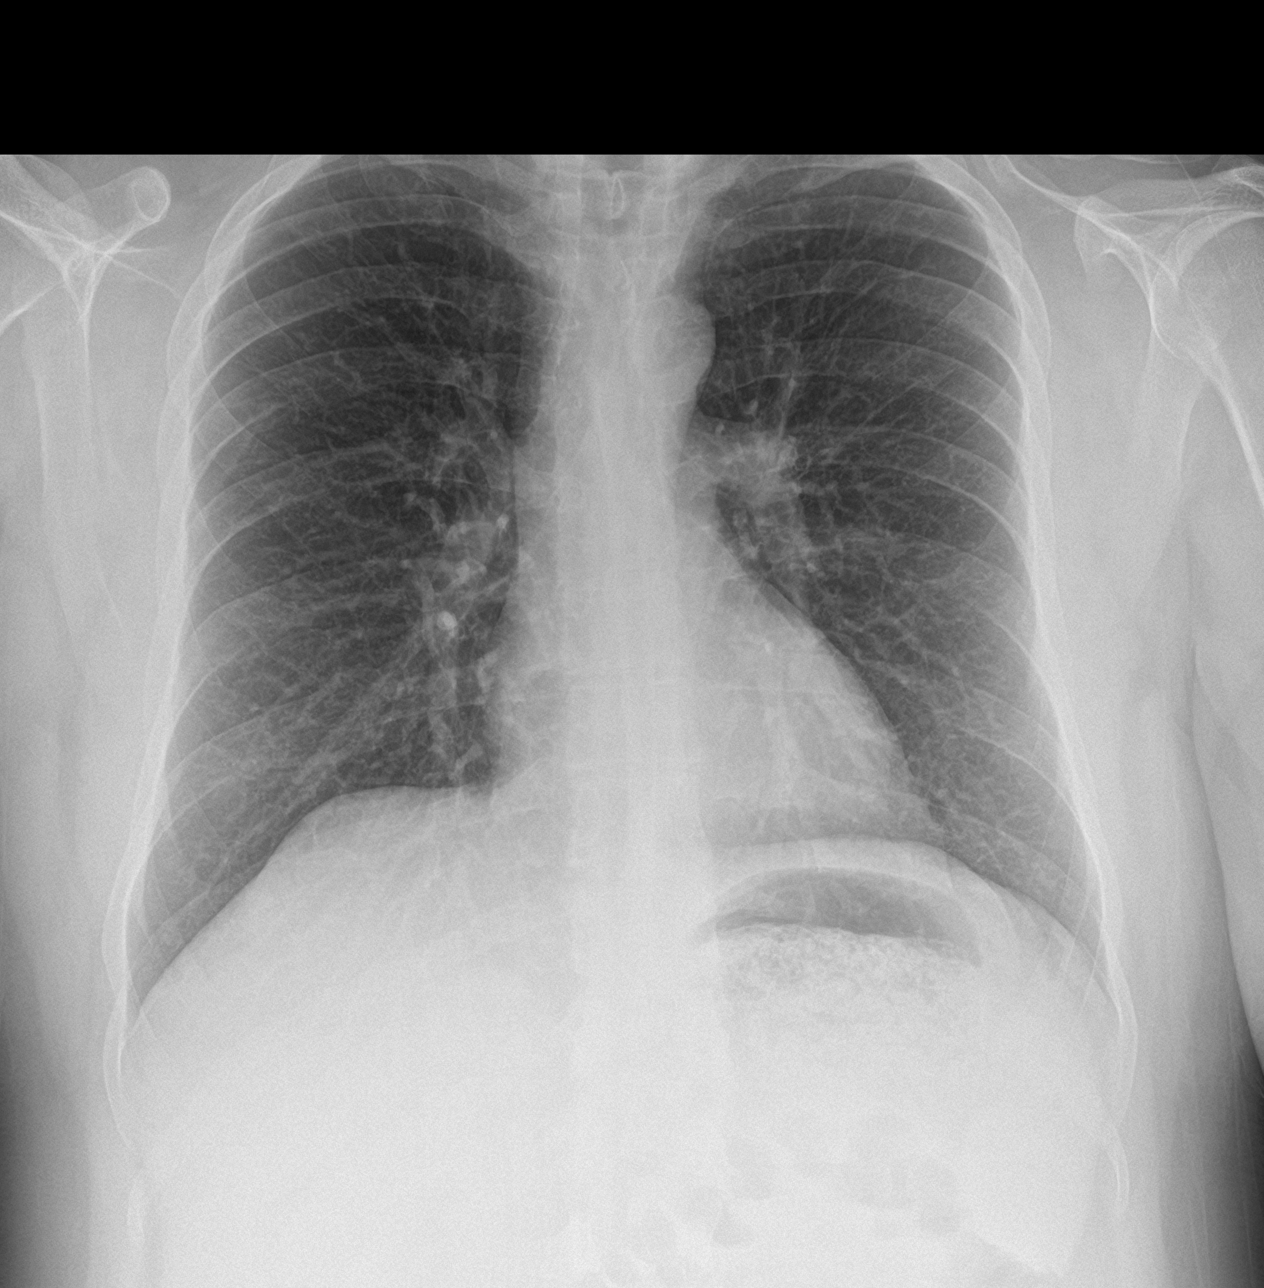
[im 2/4]
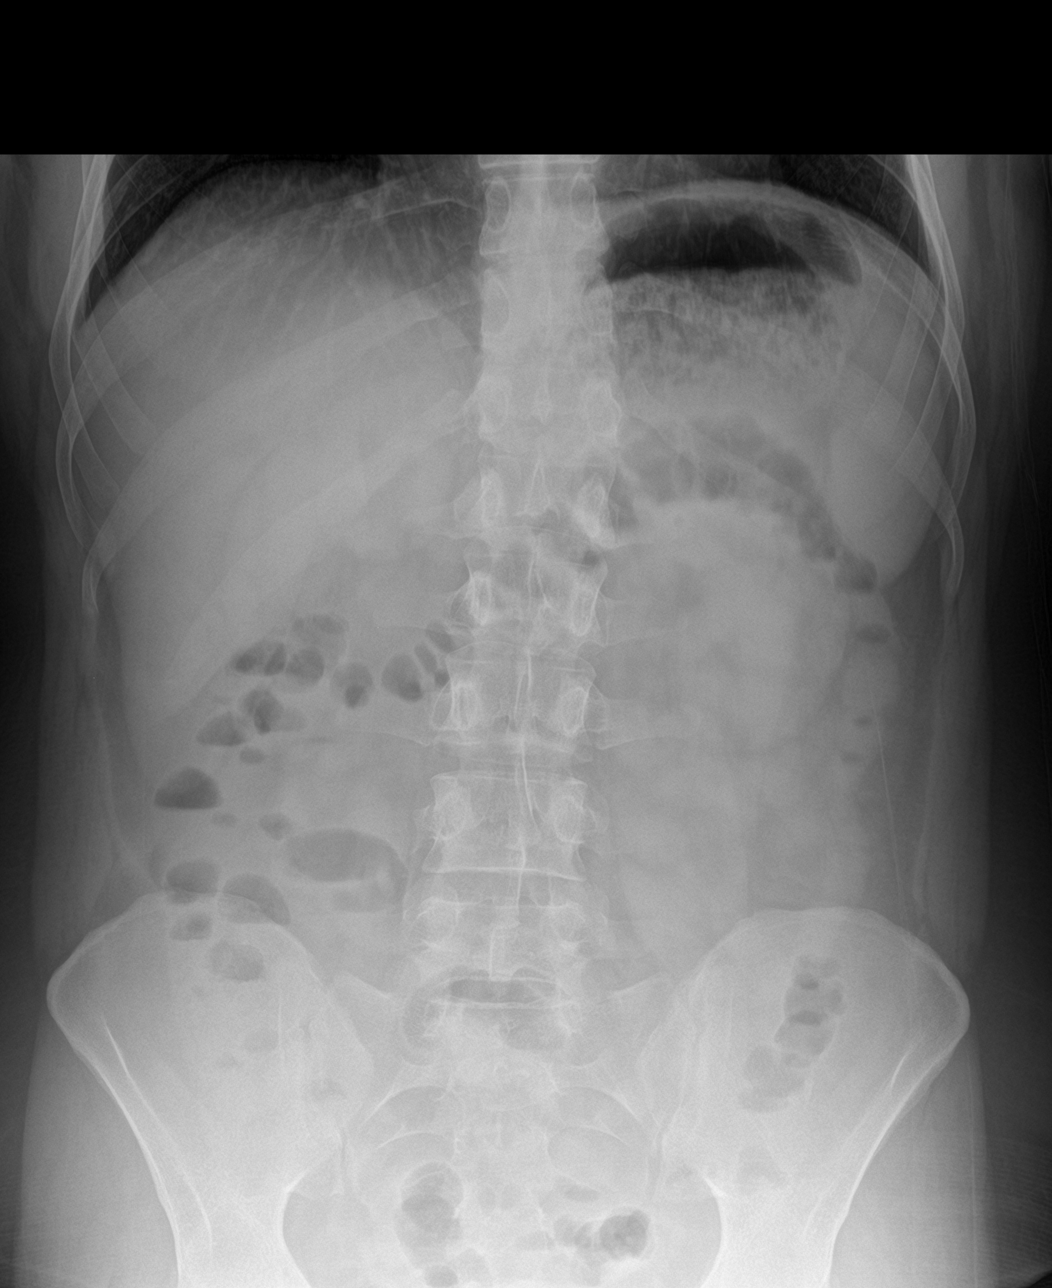
[im 3/4]
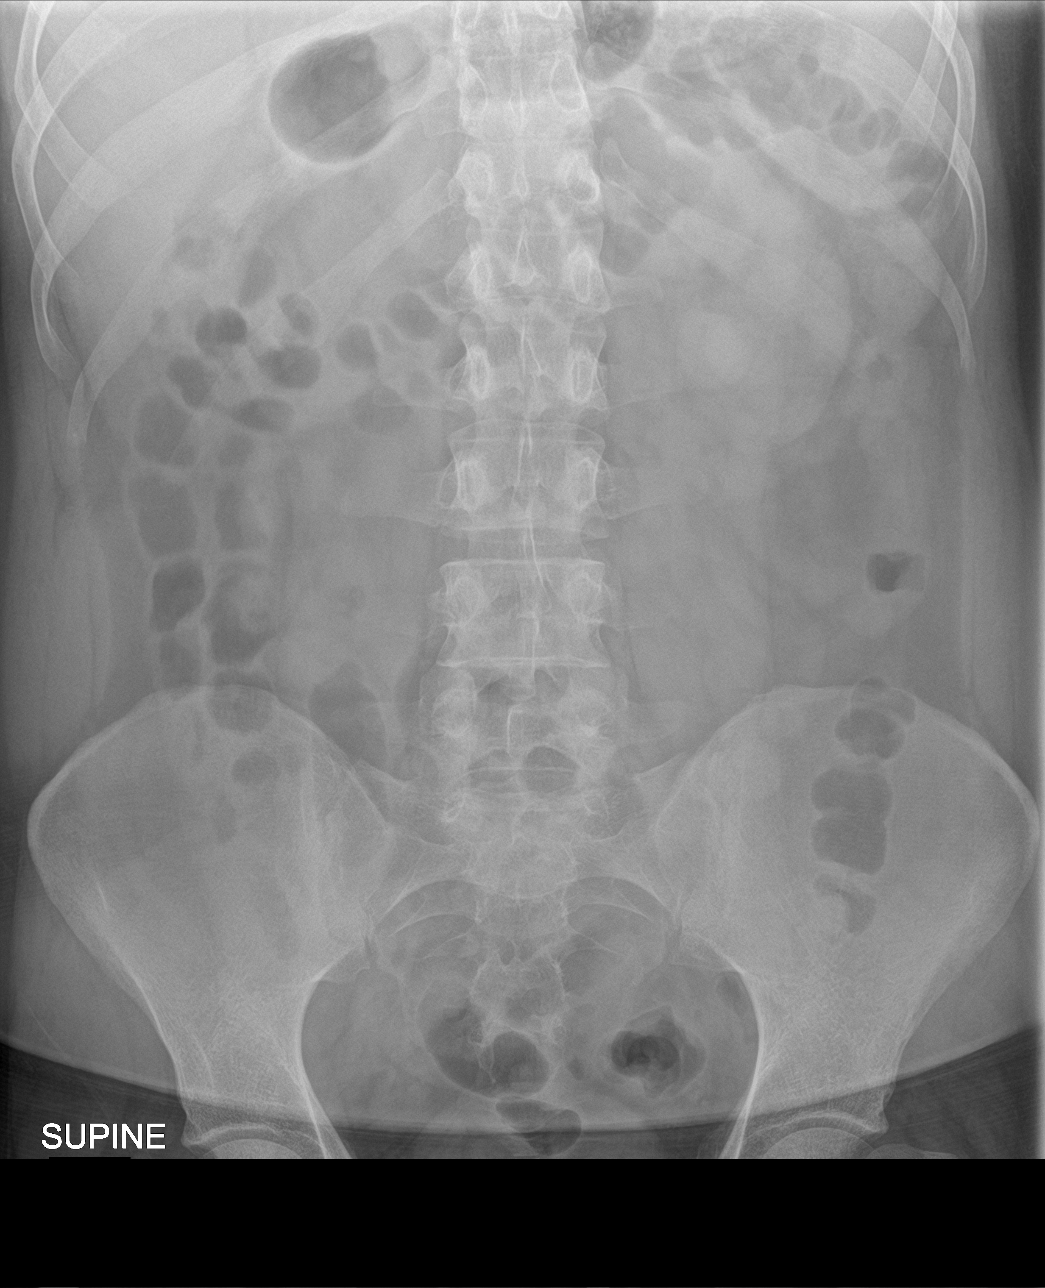
[im 4/4]
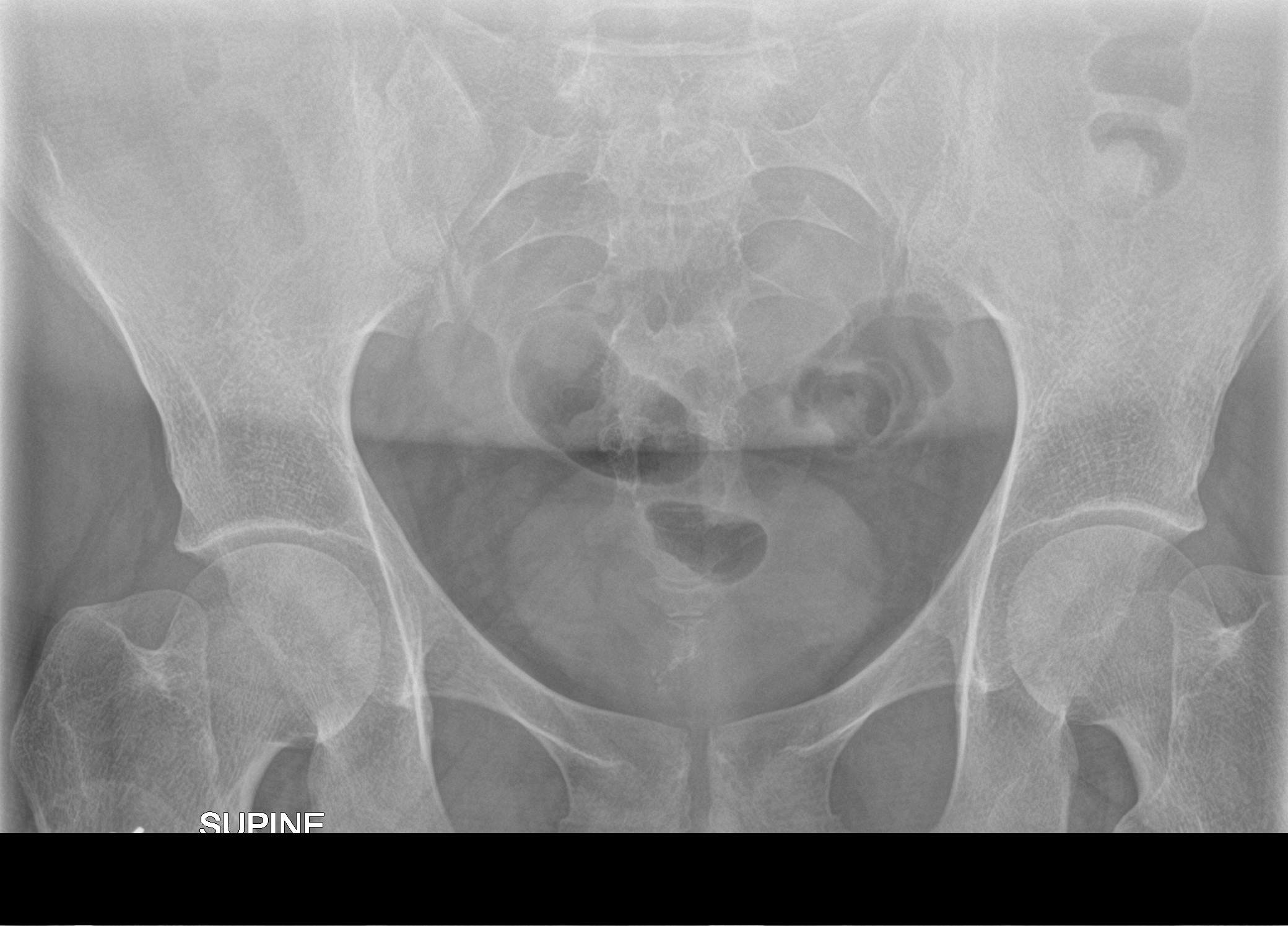

[4 of 4 positions shown; findings below may reference images not displayed]

FINDINGS: There is no evidence of dilated bowel loops or free intraperitoneal
air. No radiopaque calculi or other significant radiographic
abnormality is seen. Heart size and mediastinal contours are within
normal limits. Both lungs are clear.
IMPRESSION: Negative abdominal radiographs.  No acute cardiopulmonary disease.

## 2019-04-25 IMAGING — CT CT ABD-PELV W/ CM
2 of 4 series · 15 of 46 positions shown, 17 images · IV contrast (iopamidol)
Comparison: Ultrasound from earlier the same day

CLINICAL DATA: pain mostly in the epigastric and right upper
quadrant. There for 2 days. Has been burping. Denies fever. Has had
some slightly loose stool. Denies melena bright red blood per rectum
or fever. Has not vomited but has been nauseated. Has not had this
before. No prior surgical history. No other medical problems. He is
sharp, mostly in epigastric right upper quadrant does not radiate,
nothing makes it better food might make it worse.

EXAM:
CT ABDOMEN AND PELVIS WITH CONTRAST
TECHNIQUE: Multidetector CT imaging of the abdomen and pelvis was performed
using the standard protocol following bolus administration of
intravenous contrast.
CONTRAST:  100mL 2GGPOL-RHH IOPAMIDOL (2GGPOL-RHH) INJECTION 61%

[Series 2: routine abd/pel with · axial · 0.70mm/px · z∈[-545,-115]mm · 12 of 96 slices shown, 14 images]
[im 5/96  soft-tissue]
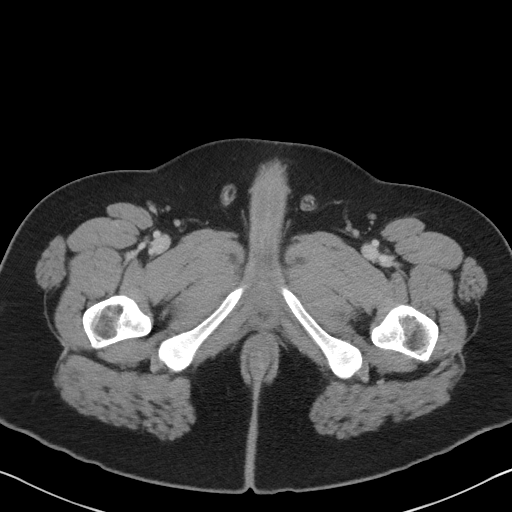
[im 5/96  bone]
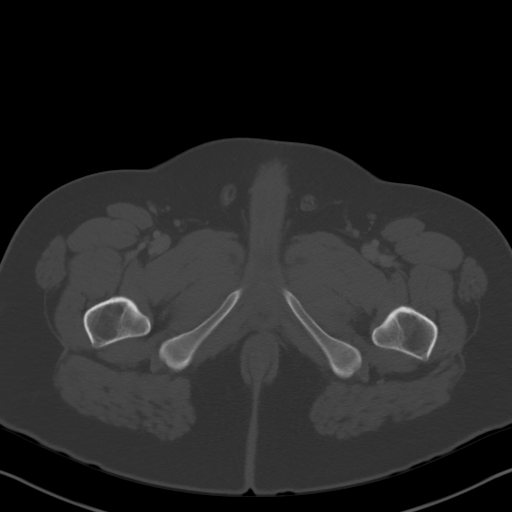
[im 13/96  soft-tissue]
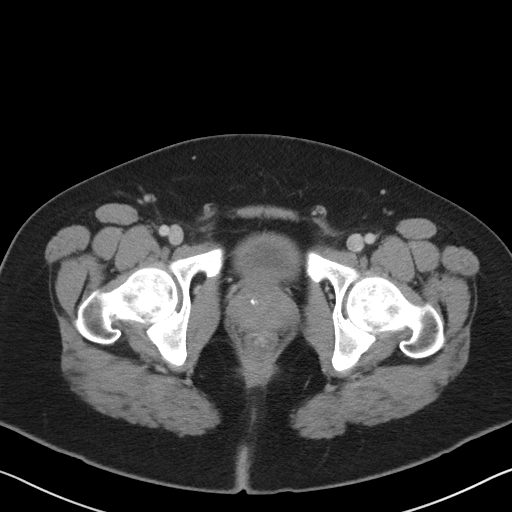
[im 21/96  soft-tissue]
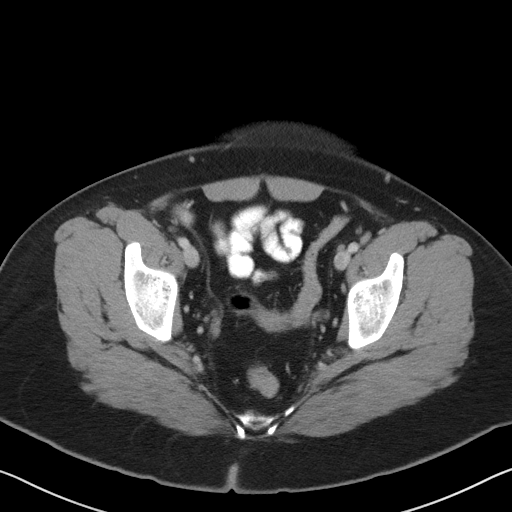
[im 29/96  soft-tissue]
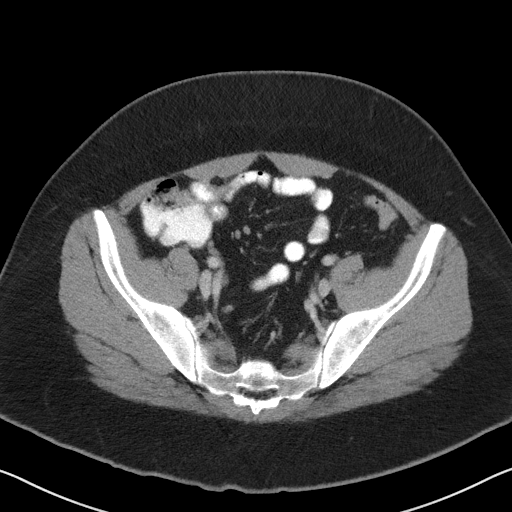
[im 38/96  soft-tissue]
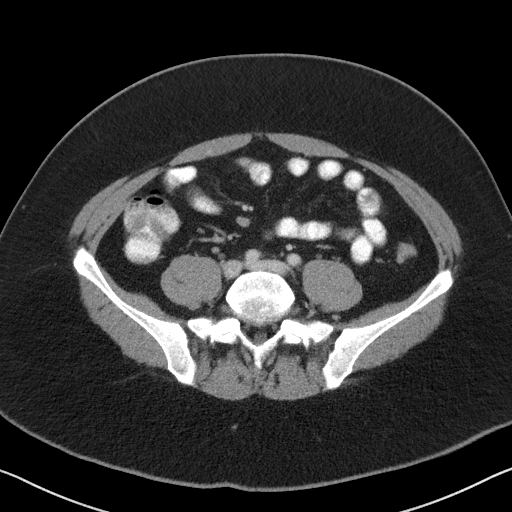
[im 46/96  soft-tissue]
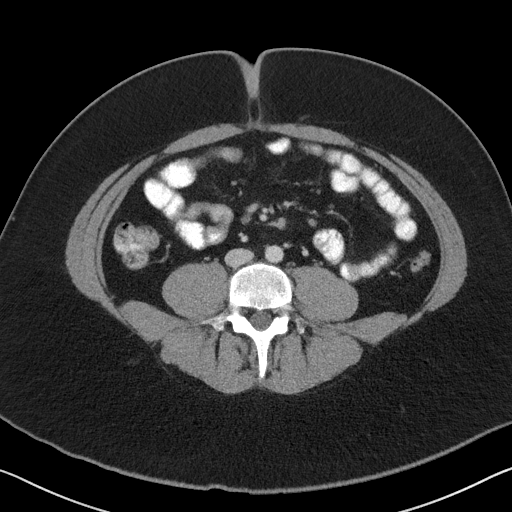
[im 50/96  soft-tissue]
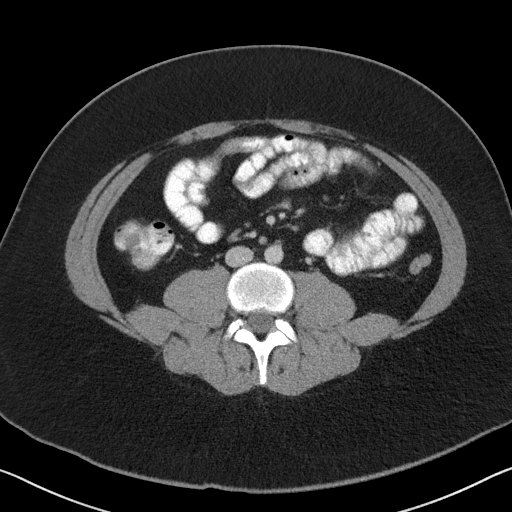
[im 58/96  soft-tissue]
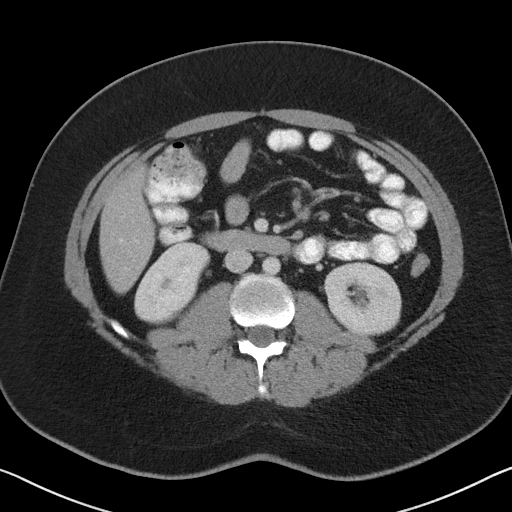
[im 67/96  soft-tissue]
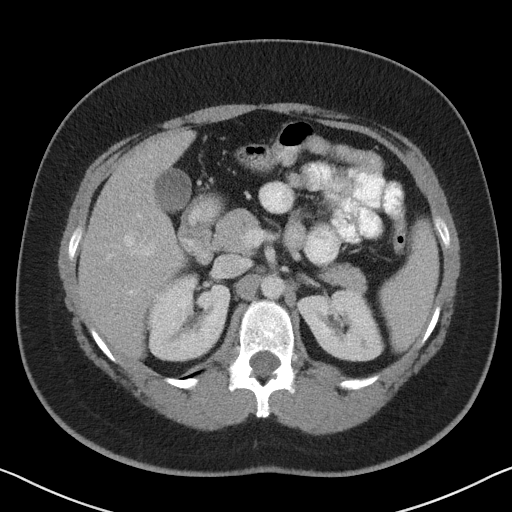
[im 67/96  bone]
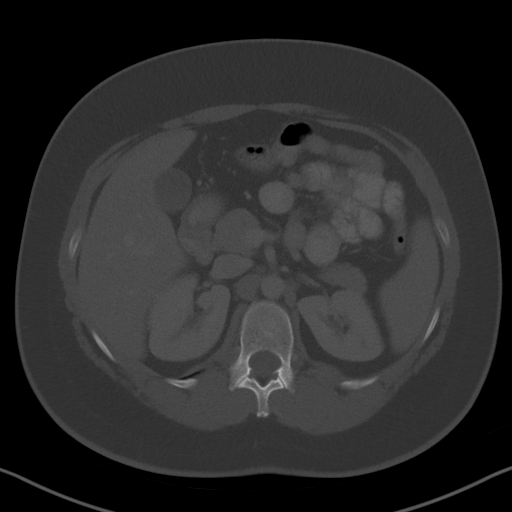
[im 75/96  soft-tissue]
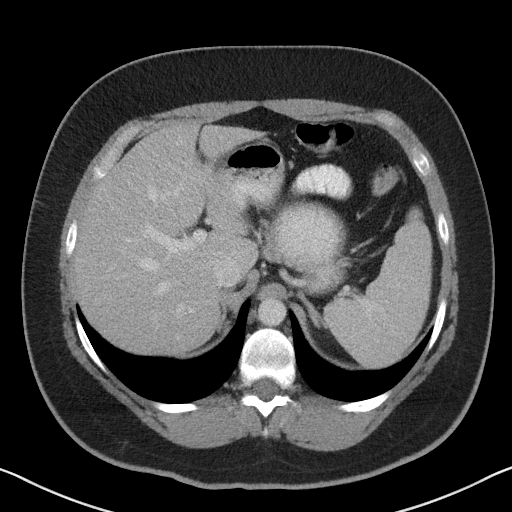
[im 83/96  soft-tissue]
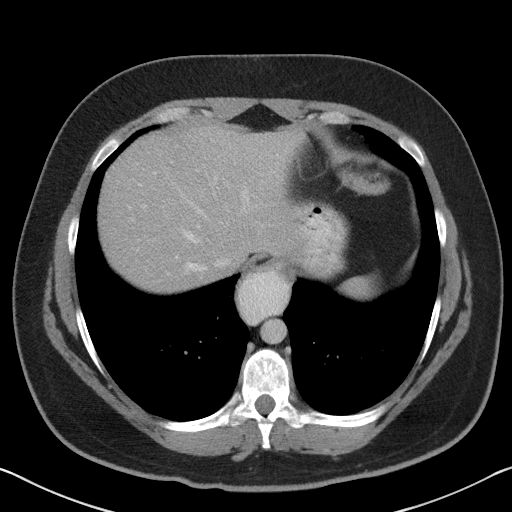
[im 91/96  soft-tissue]
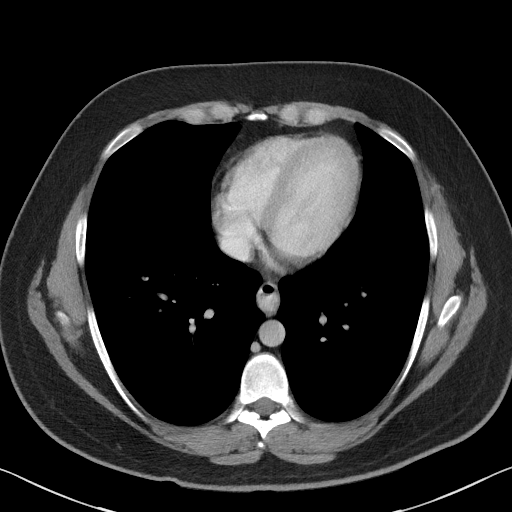

[Series 5: coronal st · coronal · 0.68mm/px · 3 of 101 slices shown]
[im 34/101  soft-tissue]
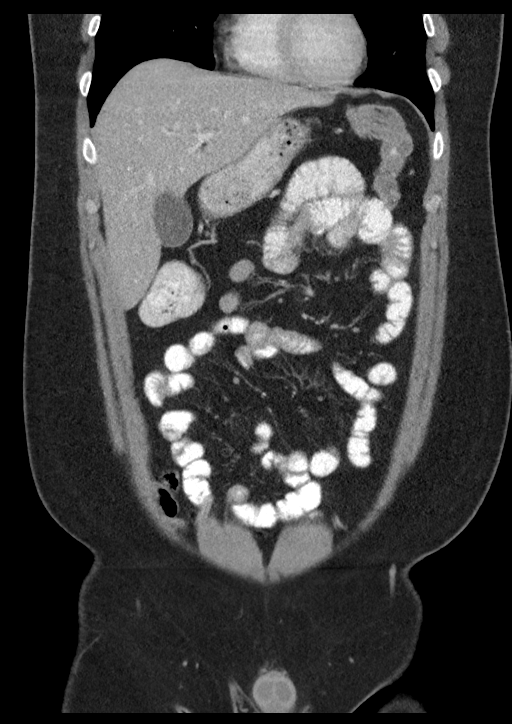
[im 45/101  soft-tissue]
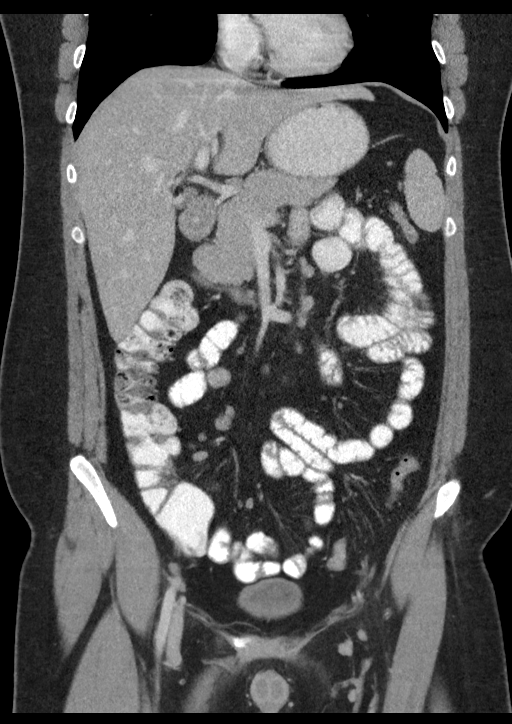
[im 56/101  soft-tissue]
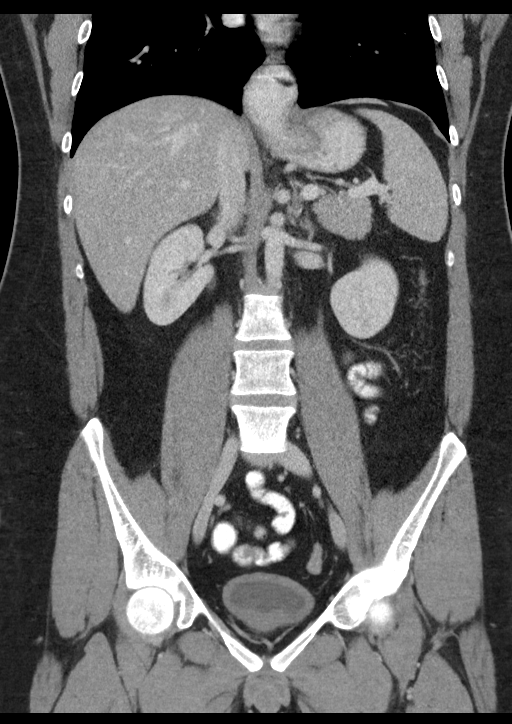

[15 of 46 positions shown; findings below may reference images not displayed]

FINDINGS: Lower chest: No acute abnormality.  Moderate Hiatal hernia.

Hepatobiliary: No focal liver abnormality is seen. No calcified
gallstones, gallbladder wall thickening, or biliary dilatation.

Pancreas: Unremarkable. No pancreatic ductal dilatation or
surrounding inflammatory changes.

Spleen: Normal in size without focal abnormality.

Adrenals/Urinary Tract: Adrenal glands are unremarkable. Kidneys are
normal, without renal calculi, focal lesion, or hydronephrosis.
Bladder is incompletely distended with mild wall thickening
suggested.

Stomach/Bowel: Moderate Hiatal hernia. Small bowel nondilated. Good
distal contrast progression. Normal appendix. Colon is nondilated,
unremarkable.

Vascular/Lymphatic: No significant vascular findings are present. No
enlarged abdominal or pelvic lymph nodes.

Reproductive: Mild prostatic enlargement with central coarse
calcifications.

Other: No ascites.  No free air.

Musculoskeletal: No acute or significant osseous findings.
IMPRESSION: 1. No acute findings.
2. Moderate hiatal hernia.
3. Prostatic enlargement with mild thickening of the wall of the
urinary bladder suggesting a degree of outflow obstruction.
